# Patient Record
Sex: Female | Born: 1985
Health system: Southern US, Community
[De-identification: ages and names within clinical notes are randomized; demographics above are authoritative.]

## PROBLEM LIST (undated history)

## (undated) DIAGNOSIS — IMO0001 Reserved for inherently not codable concepts without codable children: Secondary | ICD-10-CM

## (undated) DIAGNOSIS — J45909 Unspecified asthma, uncomplicated: Secondary | ICD-10-CM

## (undated) DIAGNOSIS — Z30017 Encounter for initial prescription of implantable subdermal contraceptive: Principal | ICD-10-CM

## (undated) HISTORY — DX: Reserved for inherently not codable concepts without codable children: IMO0001

## (undated) HISTORY — DX: Encounter for initial prescription of implantable subdermal contraceptive: Z30.017

---
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
## 2005-12-08 ENCOUNTER — Other Ambulatory Visit: Admission: RE | Admit: 2005-12-08 | Discharge: 2005-12-08 | Payer: Self-pay | Admitting: Obstetrics and Gynecology

## 2007-04-12 ENCOUNTER — Other Ambulatory Visit: Admission: RE | Admit: 2007-04-12 | Discharge: 2007-04-12 | Payer: Self-pay | Admitting: Obstetrics and Gynecology

## 2008-06-29 ENCOUNTER — Inpatient Hospital Stay (HOSPITAL_COMMUNITY): Admission: AD | Admit: 2008-06-29 | Discharge: 2008-07-02 | Payer: Self-pay | Admitting: Obstetrics and Gynecology

## 2008-06-30 ENCOUNTER — Encounter (INDEPENDENT_AMBULATORY_CARE_PROVIDER_SITE_OTHER): Payer: Self-pay | Admitting: Obstetrics and Gynecology

## 2009-10-04 ENCOUNTER — Other Ambulatory Visit: Admission: RE | Admit: 2009-10-04 | Discharge: 2009-10-04 | Payer: Self-pay | Admitting: Obstetrics and Gynecology

## 2010-07-10 LAB — COMPREHENSIVE METABOLIC PANEL
AST: 23 U/L (ref 0–37)
CO2: 21 mEq/L (ref 19–32)
Calcium: 9.8 mg/dL (ref 8.4–10.5)
Creatinine, Ser: 0.56 mg/dL (ref 0.4–1.2)
GFR calc Af Amer: >60 mL/min (ref >60)
GFR calc non Af Amer: >60 mL/min (ref >60)

## 2010-07-10 LAB — URINALYSIS, ROUTINE W REFLEX MICROSCOPIC
Glucose, UA: NEGATIVE mg/dL
Nitrite: NEGATIVE
Protein, ur: NEGATIVE mg/dL
Urobilinogen, UA: 0.2 mg/dL (ref 0.0–1.0)

## 2010-07-10 LAB — CBC
HCT: 24.5 % — ABNORMAL LOW (ref 36.0–46.0)
HCT: 39.6 % (ref 36.0–46.0)
Hemoglobin: 13.2 g/dL (ref 12.0–15.0)
Hemoglobin: 8.3 g/dL — ABNORMAL LOW (ref 12.0–15.0)
MCHC: 33.2 g/dL (ref 30.0–36.0)
MCHC: 33.4 g/dL (ref 30.0–36.0)
MCHC: 34.1 g/dL (ref 30.0–36.0)
MCV: 91.2 fL (ref 78.0–100.0)
MCV: 92.1 fL (ref 78.0–100.0)
Platelets: 168 10*3/uL (ref 150–400)
RDW: 13.2 % (ref 11.5–15.5)
RDW: 13.3 % (ref 11.5–15.5)
RDW: 13.3 % (ref 11.5–15.5)

## 2010-07-10 LAB — URINE MICROSCOPIC-ADD ON

## 2010-07-10 LAB — RPR: RPR Ser Ql: NONREACTIVE

## 2010-07-10 LAB — URIC ACID: Uric Acid, Serum: 4.1 mg/dL (ref 2.4–7.0)

## 2010-08-13 NOTE — Op Note (Signed)
[name], [name]               ACCOUNT NO.:  [id number]   MEDICAL RECORD NO.:  [id number]          PATIENT TYPE:  INP   LOCATION:  9146                          FACILITY:  [facility]   PHYSICIAN:  Charles A. Delcambre, MDDATE OF BIRTH:  Sep 06, 1985   DATE OF PROCEDURE:  06/30/2008  DATE OF DISCHARGE:                               OPERATIVE REPORT   PREOPERATIVE DIAGNOSES:  1. Intrauterine pregnancy at 39 weeks and 2 days.  2. Intrauterine growth restriction.  3. Nonreassuring fetal heart rate.   POSTOPERATIVE DIAGNOSES:  1. Intrauterine pregnancy at 39 weeks and 2 days.  2. Intrauterine growth restriction.  3. Nonreassuring fetal heart rate.   PROCEDURE:  Primary low transverse cesarean section.   SURGEON:  Charles A. Delcambre, MD   ASSISTANT:  None.   COMPLICATIONS:  None.   ESTIMATED BLOOD LOSS:  600 mL.   OPERATIVE FINDINGS:  Vigorous female, Apgars 9 and 9, 5 pounds 10 ounces.   SPECIMEN:  Placenta to Pathology.   ANESTHESIA:  Epidural.   URINE OUTPUT:  100 mL.   IV FLUIDS:  1000 mL.   Instrument, sponge, and needle count correct x2.   DESCRIPTION OF PROCEDURE:  The patient was taken to the operating room  secondary to nonreassuring fetal heart rate.  She was 8-cm dilated at  that time, but was not making very rapid progress and late decelerations  were noted intermittently and then repetitive.  At that point, I  recommended cesarean section.  She gave informed consent, see notes in  the chart.  Surgery was undertaken with epidural adequate, sterile prep  and drape was undertaken.  Pfannenstiel incision was made with a knife,  carried down to fascia.  The fascia was incised with a knife and Mayo  scissors.  Rectus sheath was bluntly and sharply dissected in the  midline.  Peritoneum was entered bluntly with finger.  Traction was used  to extend the incision.  There was no damage to the bladder.  Self-  retaining retractor was placed with the ring inside and was  swept, no  bowel was under the ring and the ring was rolled down.  Alexis retractor  was placed and hand was swept and no bowel or other structures were into  the ring internally, outer ring was cinched down to allow adequate  exposure.  Vesicouterine peritoneum was incised with Metzenbaum  scissors.  Blunt dissection and some sharp dissection was used take the  bladder flap down.  A transverse incision was made with a knife, carried  down to fat to amniotomy without damage to the infant.  Vertical  traction was used to extend the incision.  Occiput was brought to the  incision site without difficulty.  Fundal pressure was placed by the  operator's assistance and baby was delivered without difficulty, once  vigorous immediately.  Cord was clamped.  Infant was shown to the  parents and handed off to Dr. Alison Murray, who was in attendance with the  delivery.  Placenta was manually expressed, handed off to go to cord  blood collection for the cord bank and then to  Pathology.  Internal  surface of the uterus was wiped with a dry lap.  The uterus was closed  in 2 layers, first layer #1 chromic running locking, second layer #1  chromic running imbricating and nonlocking.  A single figure-of-eight  suture was used to achieve hemostasis after the uterus was free  internalized, it had been externalized for repair.  Irrigation was  carried out.  All hemostatic areas were noted.  All incisions were  hemostatic.  Irrigation was carried out subfascially, pericolic gutters  and bladder area as well as subfascially and subcutaneously.  The  subfascial hemostasis was excellent.  Fascia was closed with #1 Vicryl  running nonlocking suture, subcutaneous hemostasis was excellent.  Skin  was closed with sterile skin clips.  Sterile dressing was applied.  The  patient tolerated the procedure well and was taken to recovery with  physician in attendance.      Charles A. Sydnee Cabal, MD  Electronically  Signed     CAD/MEDQ  D:  06/30/2008  T:  07/01/2008  Job:  [id number]

## 2010-08-16 NOTE — Discharge Summary (Signed)
[name], [name] NO.:  [id number]   MEDICAL RECORD NO.:  [id number]          PATIENT TYPE:  INP   LOCATION:  9146                          FACILITY:  [facility]   PHYSICIAN:  Gerald Leitz, MD          DATE OF BIRTH:  29-May-1985   DATE OF ADMISSION:  06/29/2008  DATE OF DISCHARGE:  07/02/2008                               DISCHARGE SUMMARY   INDICATIONS FOR ADMISSION:  1. A 39 and 1-week intrauterine pregnancy.  2. Gestational hypertension.  3. Small for gestational age.  4. Borderline intrauterine growth retardation.   DISCHARGE DIAGNOSES:  1. A 39 and 1-week intrauterine pregnancy.  2. Gestational hypertension.  3. Small for gestational age.  4. Borderline intrauterine growth retardation.  5. Non-reassuring fetal heart rate and status post primary cesarean      section.   BRIEF HOSPITAL COURSE:  The patient was admitted on June 29, 2008 at 39  weeks and 1-day with gestational hypertension.  Blood pressure was  154/95.  Urinalysis was negative for protein.  Her labs were negative.  She received Cytotec for induction.  She progressed to maximum  dilatation of 3-8 cm and developed non-reassuring fetal heart rate.  Underwent cesarean section on June 30, 2008.  Delivered live born female  infant with Apgar of 9 and 9 at 105 and respectively weighing 5 pounds  10 ounces.  She did well postoperatively and is discharged on postop day  #2 on the following medications Motrin and Percocet.   FOLLOW UP:  Staple removal in 2-3 days.   DISCHARGE CONDITION:  Stable and improved.      Gerald Leitz, MD  Electronically Signed     TC/MEDQ  D:  07/24/2008  T:  07/25/2008  Job:  [id number]

## 2011-02-25 ENCOUNTER — Other Ambulatory Visit (HOSPITAL_COMMUNITY): Payer: Self-pay | Admitting: Family Medicine

## 2011-02-25 DIAGNOSIS — R1011 Right upper quadrant pain: Secondary | ICD-10-CM

## 2011-02-25 DIAGNOSIS — R5383 Other fatigue: Secondary | ICD-10-CM

## 2011-02-25 DIAGNOSIS — R109 Unspecified abdominal pain: Secondary | ICD-10-CM

## 2011-02-28 ENCOUNTER — Other Ambulatory Visit (HOSPITAL_COMMUNITY): Payer: Self-pay

## 2011-02-28 ENCOUNTER — Ambulatory Visit (HOSPITAL_COMMUNITY)
Admission: RE | Admit: 2011-02-28 | Discharge: 2011-02-28 | Disposition: A | Discharge status: Home / Self Care | Payer: PRIVATE HEALTH INSURANCE | Source: Ambulatory Visit | Attending: Family Medicine | Admitting: Family Medicine

## 2011-02-28 DIAGNOSIS — R5383 Other fatigue: Secondary | ICD-10-CM

## 2011-02-28 DIAGNOSIS — R1011 Right upper quadrant pain: Secondary | ICD-10-CM

## 2011-02-28 DIAGNOSIS — R109 Unspecified abdominal pain: Secondary | ICD-10-CM | POA: Insufficient documentation

## 2011-04-01 NOTE — L&D Delivery Note (Addendum)
Delivery Note At 6:19 AM a viable female was delivered via VBAC, Spontaneous (Presentation: Right Occiput Anterior).  APGAR: 8, 9; weight 5 lb 7.8 oz (2489 g).   Placenta status: Intact, Spontaneous.  Cord: 3 vessels with the following complications: None.    NICU team in attendance of birth d/t moderate MSF/IUGR  Anesthesia: Epidural  Episiotomy: None Lacerations: 1st degree;Perineal; Rt Labial Suture Repair: 4.0 monocryl Est. Blood Loss (mL): 300  Mom to postpartum.  Baby to nursery-stable. Placenta to pathology (IUGR, low grade maternal temp)  Wants to breastfeed, depo for contraception.  Marge Duncans 02/29/2012, 6:49 AM

## 2011-06-03 ENCOUNTER — Other Ambulatory Visit: Payer: Self-pay | Admitting: Adult Health

## 2011-06-03 ENCOUNTER — Other Ambulatory Visit (HOSPITAL_COMMUNITY)
Admission: RE | Admit: 2011-06-03 | Discharge: 2011-06-03 | Disposition: A | Discharge status: Home / Self Care | Payer: 59 | Source: Ambulatory Visit | Attending: Obstetrics and Gynecology | Admitting: Obstetrics and Gynecology

## 2011-06-03 DIAGNOSIS — Z01419 Encounter for gynecological examination (general) (routine) without abnormal findings: Secondary | ICD-10-CM | POA: Insufficient documentation

## 2011-07-01 LAB — OB RESULTS CONSOLE HEPATITIS B SURFACE ANTIGEN: Hepatitis B Surface Ag: NEGATIVE

## 2011-07-01 LAB — OB RESULTS CONSOLE ANTIBODY SCREEN: Antibody Screen: NEGATIVE

## 2011-07-01 LAB — OB RESULTS CONSOLE ABO/RH: RH Type: POSITIVE

## 2011-07-01 LAB — OB RESULTS CONSOLE RUBELLA ANTIBODY, IGM: Rubella: IMMUNE

## 2012-02-23 ENCOUNTER — Telehealth (HOSPITAL_COMMUNITY): Payer: Self-pay | Admitting: *Deleted

## 2012-02-23 ENCOUNTER — Encounter (HOSPITAL_COMMUNITY): Payer: Self-pay | Admitting: *Deleted

## 2012-02-23 NOTE — Telephone Encounter (Signed)
Preadmission screen  

## 2012-02-28 ENCOUNTER — Encounter (HOSPITAL_COMMUNITY): Payer: Self-pay | Admitting: Anesthesiology

## 2012-02-28 ENCOUNTER — Inpatient Hospital Stay (HOSPITAL_COMMUNITY)
Admission: RE | Admit: 2012-02-28 | Discharge: 2012-03-01 | DRG: 774 | Disposition: A | Discharge status: Home / Self Care | Payer: PRIVATE HEALTH INSURANCE | Source: Ambulatory Visit | Attending: Family Medicine | Admitting: Family Medicine

## 2012-02-28 ENCOUNTER — Inpatient Hospital Stay (HOSPITAL_COMMUNITY): Payer: PRIVATE HEALTH INSURANCE | Admitting: Anesthesiology

## 2012-02-28 ENCOUNTER — Encounter (HOSPITAL_COMMUNITY): Payer: Self-pay

## 2012-02-28 DIAGNOSIS — O365990 Maternal care for other known or suspected poor fetal growth, unspecified trimester, not applicable or unspecified: Principal | ICD-10-CM | POA: Diagnosis present

## 2012-02-28 DIAGNOSIS — O34219 Maternal care for unspecified type scar from previous cesarean delivery: Secondary | ICD-10-CM | POA: Diagnosis present

## 2012-02-28 DIAGNOSIS — O328XX0 Maternal care for other malpresentation of fetus, not applicable or unspecified: Secondary | ICD-10-CM | POA: Diagnosis present

## 2012-02-28 HISTORY — DX: Unspecified asthma, uncomplicated: J45.909

## 2012-02-28 LAB — CBC
MCH: 31.2 pg (ref 26.0–34.0)
MCHC: 34.4 g/dL (ref 30.0–36.0)
MCV: 90.6 fL (ref 78.0–100.0)
Platelets: 254 10*3/uL (ref 150–400)
RBC: 4.17 MIL/uL (ref 3.87–5.11)
RDW: 12.8 % (ref 11.5–15.5)

## 2012-02-28 LAB — TYPE AND SCREEN: ABO/RH(D): A POS

## 2012-02-28 LAB — RPR: RPR Ser Ql: NONREACTIVE

## 2012-02-28 MED ORDER — FENTANYL 2.5 MCG/ML BUPIVACAINE 1/10 % EPIDURAL INFUSION (WH - ANES)
14.0000 mL/h | INTRAMUSCULAR | Status: DC
  Administered 2012-02-29: 14 mL/h via EPIDURAL
  Filled 2012-02-28 (×2): qty 125

## 2012-02-28 MED ORDER — FENTANYL 2.5 MCG/ML BUPIVACAINE 1/10 % EPIDURAL INFUSION (WH - ANES)
INTRAMUSCULAR | Status: DC | PRN
  Administered 2012-02-28: 14 mL/h via EPIDURAL

## 2012-02-28 MED ORDER — IBUPROFEN 600 MG PO TABS: 600.0000 mg | ORAL_TABLET | Freq: Four times a day (QID) | ORAL | Status: DC | PRN

## 2012-02-28 MED ORDER — OXYTOCIN 40 UNITS IN LACTATED RINGERS INFUSION - SIMPLE MED
1.0000 m[IU]/min | INTRAVENOUS | Status: DC
  Administered 2012-02-28: 1 m[IU]/min via INTRAVENOUS
  Filled 2012-02-28: qty 1000

## 2012-02-28 MED ORDER — LIDOCAINE HCL (PF) 1 % IJ SOLN
INTRAMUSCULAR | Status: DC | PRN
  Administered 2012-02-28 (×2): 8 mL

## 2012-02-28 MED ORDER — TERBUTALINE SULFATE 1 MG/ML IJ SOLN: 0.2500 mg | Freq: Once | INTRAMUSCULAR | Status: AC | PRN

## 2012-02-28 MED ORDER — OXYTOCIN BOLUS FROM INFUSION
500.0000 mL | INTRAVENOUS | Status: DC
  Administered 2012-02-29: 500 mL via INTRAVENOUS

## 2012-02-28 MED ORDER — LACTATED RINGERS IV SOLN
500.0000 mL | INTRAVENOUS | Status: DC | PRN
  Administered 2012-02-29: 500 mL via INTRAVENOUS

## 2012-02-28 MED ORDER — NALBUPHINE SYRINGE 5 MG/0.5 ML: 5.0000 mg | INJECTION | INTRAMUSCULAR | Status: DC | PRN

## 2012-02-28 MED ORDER — LACTATED RINGERS IV SOLN: 500.0000 mL | Freq: Once | INTRAVENOUS | Status: DC

## 2012-02-28 MED ORDER — PHENYLEPHRINE 40 MCG/ML (10ML) SYRINGE FOR IV PUSH (FOR BLOOD PRESSURE SUPPORT): 80.0000 ug | PREFILLED_SYRINGE | INTRAVENOUS | Status: DC | PRN

## 2012-02-28 MED ORDER — HYDROXYZINE HCL 50 MG PO TABS: 50.0000 mg | ORAL_TABLET | Freq: Four times a day (QID) | ORAL | Status: DC | PRN

## 2012-02-28 MED ORDER — LACTATED RINGERS IV SOLN
INTRAVENOUS | Status: DC
  Administered 2012-02-28 (×2): via INTRAVENOUS
  Administered 2012-02-28: 125 mL/h via INTRAVENOUS
  Administered 2012-02-28 (×2): via INTRAVENOUS
  Administered 2012-02-29: 125 mL/h via INTRAVENOUS

## 2012-02-28 MED ORDER — HYDROXYZINE HCL 50 MG/ML IM SOLN: 50.0000 mg | Freq: Four times a day (QID) | INTRAMUSCULAR | Status: DC | PRN

## 2012-02-28 MED ORDER — FLEET ENEMA 7-19 GM/118ML RE ENEM: 1.0000 | ENEMA | RECTAL | Status: DC | PRN

## 2012-02-28 MED ORDER — LIDOCAINE HCL (PF) 1 % IJ SOLN
30.0000 mL | INTRAMUSCULAR | Status: DC | PRN
  Filled 2012-02-28: qty 30

## 2012-02-28 MED ORDER — OXYCODONE-ACETAMINOPHEN 5-325 MG PO TABS: 1.0000 | ORAL_TABLET | ORAL | Status: DC | PRN

## 2012-02-28 MED ORDER — PHENYLEPHRINE 40 MCG/ML (10ML) SYRINGE FOR IV PUSH (FOR BLOOD PRESSURE SUPPORT)
80.0000 ug | PREFILLED_SYRINGE | INTRAVENOUS | Status: DC | PRN
  Filled 2012-02-28: qty 5

## 2012-02-28 MED ORDER — OXYTOCIN 40 UNITS IN LACTATED RINGERS INFUSION - SIMPLE MED: 62.5000 mL/h | INTRAVENOUS | Status: DC

## 2012-02-28 MED ORDER — ONDANSETRON HCL 4 MG/2ML IJ SOLN
4.0000 mg | Freq: Four times a day (QID) | INTRAMUSCULAR | Status: DC | PRN
  Administered 2012-02-29: 4 mg via INTRAVENOUS
  Filled 2012-02-28: qty 2

## 2012-02-28 MED ORDER — DIPHENHYDRAMINE HCL 50 MG/ML IJ SOLN: 12.5000 mg | INTRAMUSCULAR | Status: DC | PRN

## 2012-02-28 MED ORDER — EPHEDRINE 5 MG/ML INJ: 10.0000 mg | INTRAVENOUS | Status: DC | PRN

## 2012-02-28 MED ORDER — EPHEDRINE 5 MG/ML INJ
10.0000 mg | INTRAVENOUS | Status: DC | PRN
  Filled 2012-02-28: qty 4

## 2012-02-28 MED ORDER — ACETAMINOPHEN 325 MG PO TABS: 650.0000 mg | ORAL_TABLET | ORAL | Status: DC | PRN

## 2012-02-28 MED ORDER — CITRIC ACID-SODIUM CITRATE 334-500 MG/5ML PO SOLN: 30.0000 mL | ORAL | Status: DC | PRN

## 2012-02-28 NOTE — H&P (Signed)
[name] is a 26 y.o. G2P1001 female at [redacted]w[redacted]d by LMP which correlates well w/ 7wk u/s, presenting for IOL d/t IUGR <3% w/ normal AFI and dopplers.  Has been followed w/ twice weekly testing: NSTs alternating w/ U/S.  Last growth u/s on chart @ 36.3wk, 2034gm/4lb 8oz/<3%. H/O PLTCS for NRFHR in 2010- had gotten to 8cm, desires TOLAC, consent signed and on chart.  Receives regular pnc at [facility], w/ onset at 4wks.   Genetic screening normal per pt, unable to locate on prenatal records.  Normal 2hr glucola, GBS neg.  Anatomy screen normal except bilateral EICF noted.   History OB History    Grav Para Term Preterm Abortions TAB SAB Ect Mult Living   2 1 1       1      Past Medical History  Diagnosis Date  . Asthma    Past Surgical History  Procedure Date  . Cesarean section    Family History: family history is not on file. Social History:  reports that she has never smoked. She does not have any smokeless tobacco history on file. She reports that she does not drink alcohol or use illicit drugs.   Prenatal Transfer Tool  Maternal Diabetes: No Genetic Screening: Normal Maternal Ultrasounds/Referrals: Abnormal:  Findings:   IUGR, Other: bilateral EICF Fetal Ultrasounds or other Referrals:  None Maternal Substance Abuse:  No Significant Maternal Medications:  None Significant Maternal Lab Results:  Lab values include: Group B Strep negative Other Comments:  None  Review of Systems  Constitutional: Negative.   HENT: Negative.   Eyes: Negative.   Respiratory: Negative.   Cardiovascular: Negative.   Gastrointestinal: Negative.   Genitourinary: Negative.   Musculoskeletal: Negative.   Skin: Negative.   Neurological: Negative.   Endo/Heme/Allergies: Negative.   Psychiatric/Behavioral: Negative.     Dilation: 1.5 Effacement (%): 50 Station: -3 Exam by:: K. Janautica Netzley CNM Blood pressure 143/80, pulse 97, temperature 98.5 F (36.9 C), temperature source Oral, resp. rate 20, height 5'  10" (1.778 m), weight 73.936 kg (163 lb), last menstrual period 05/29/2011.  Cervical foley bulb inserted and inflated w/ 60ml LR w/ small amount difficulty d/t cervix being so posterior  Maternal Exam:  Uterine Assessment: Contraction strength is mild.  Contraction frequency is irregular.   Abdomen: Surgical scars: low transverse.   Fetal presentation: vertex  Introitus: Normal vulva. Normal vagina.  Pelvis: adequate for delivery.   Cervix: Cervix evaluated by digital exam.     Physical Exam  Constitutional: She is oriented to person, place, and time. She appears well-developed and well-nourished.  HENT:  Head: Normocephalic.  Neck: Normal range of motion.  Cardiovascular: Normal rate and regular rhythm.   Respiratory: Effort normal and breath sounds normal.  GI: Soft.       gravid  Genitourinary: Vagina normal and uterus normal.       Sve: 1.5/50/-3, vtx, posterior  Musculoskeletal: Normal range of motion.  Neurological: She is alert and oriented to person, place, and time. She has normal reflexes.  Skin: Skin is warm and dry.  Psychiatric: She has a normal mood and affect. Her behavior is normal. Judgment and thought content normal.    FHR: 140, mod variability, 15x15accels, no decels= Cat I UCs: mild, irregular  Prenatal labs: ABO, Rh: A/Positive/-- (04/02 0000) Antibody: Negative (04/02 0000) Rubella: Immune (04/02 0000) RPR: Nonreactive (04/02 0000)  HBsAg: Negative (04/02 0000)  HIV: Non-reactive (04/02 0000)  GBS: Negative (11/07 0000)  2hr glucola: 96,04,54  Assessment/Plan: A:  [redacted]w[redacted]d SIUP  IOL for IUGR <3%   TOLAC  GBS neg  Cat I FHR  Bilateral EICF noted on u/s  P:  Admit to BS  Cervical foley bulb then pitocin  IV pain meds when needed/epidural when in active labor  Anticipate NSVD     Marge Duncans 02/28/2012, 10:41 AM

## 2012-02-28 NOTE — Anesthesia Preprocedure Evaluation (Signed)
Anesthesia Evaluation  Patient identified by MRN, date of birth, ID band Patient awake    Reviewed: Allergy & Precautions, H&P , NPO status , Patient's Chart, lab work & pertinent test results  Airway Mallampati: II TM Distance: >3 FB Neck ROM: full    Dental No notable dental hx.    Pulmonary    Pulmonary exam normal       Cardiovascular negative cardio ROS      Neuro/Psych negative neurological ROS  negative psych ROS   GI/Hepatic negative GI ROS, Neg liver ROS,   Endo/Other  negative endocrine ROS  Renal/GU negative Renal ROS  negative genitourinary   Musculoskeletal negative musculoskeletal ROS (+)   Abdominal Normal abdominal exam  (+)   Peds negative pediatric ROS (+)  Hematology negative hematology ROS (+)   Anesthesia Other Findings   Reproductive/Obstetrics (+) Pregnancy                           Anesthesia Physical Anesthesia Plan  ASA: II  Anesthesia Plan: Epidural   Post-op Pain Management:    Induction:   Airway Management Planned:   Additional Equipment:   Intra-op Plan:   Post-operative Plan:   Informed Consent: I have reviewed the patients History and Physical, chart, labs and discussed the procedure including the risks, benefits and alternatives for the proposed anesthesia with the patient or authorized representative who has indicated his/her understanding and acceptance.     Plan Discussed with:   Anesthesia Plan Comments:         Anesthesia Quick Evaluation

## 2012-02-28 NOTE — Progress Notes (Signed)
VE by Dr.Constant, attempted to reduce infant's hand without success, pt tol well

## 2012-02-28 NOTE — Progress Notes (Signed)
CNM in department and notified of FHR tracing and RN interventions. CNM reviewed tracing and stated RN could increase pitocin level. Will continue to monitor.

## 2012-02-28 NOTE — Progress Notes (Signed)
[name] is a 26 y.o. G2P1001 at [redacted]w[redacted]d admitted for induction of labor due to IUGR.  Subjective: Feeling painful uc's in low back, mom supportive at bs.  Objective: BP 140/78  Pulse 105  Temp 97.9 F (36.6 C) (Oral)  Resp 20  Ht 5\' 10"  (1.778 m)  Wt 73.936 kg (163 lb)  BMI 23.39 kg/m2  SpO2 97%  LMP 05/29/2011      FHT:  FHR: 135 bpm, variability: moderate,  accelerations:  Present,  decelerations:  Absent UC:   regular, every 3-4 minutes SVE:   4.5/50/-2, post, bbow AROM large amount light MSF Compound hand/arm felt on maternal Lt side of fetal head Prolonged decel after arom, w/ maternal position change, iv fluid bolus, O2 @ 10l/min given   Labs: Lab Results  Component Value Date   WBC 11.4* 02/28/2012   HGB 13.0 02/28/2012   HCT 37.8 02/28/2012   MCV 90.6 02/28/2012   PLT 254 02/28/2012    Assessment / Plan: IOL d/t IUGR, currently on 70mu/min pitocin, now arom'd- vtx w/ compound hand/arm  Labor: early labor Preeclampsia:  n/a Fetal Wellbeing:  Category I before arom, Cat II after Pain Control:  Labor support without medications I/D:  n/a Anticipated MOD:  TOLAC  Notified dr. Jolayne Panther of above, will attempt to reduce compound hand once fhr reassuring  Marge Duncans 02/28/2012, 6:29 PM

## 2012-02-28 NOTE — Progress Notes (Signed)
[name] is a 26 y.o. G2P1001 at [redacted]w[redacted]d admitted for induction of labor due to IUGR.  Subjective: Getting uncomfortable w/ uc's.  Contemplating iv pain meds vs. Epidural. Family supportive at bs.   Objective: BP 137/56  Pulse 90  Temp 97.9 F (36.6 C) (Oral)  Resp 20  Ht 5\' 10"  (1.778 m)  Wt 73.936 kg (163 lb)  BMI 23.39 kg/m2  SpO2 100%  LMP 05/29/2011      FHT:  FHR: 155 bpm, variability: moderate,  accelerations:  Present,  decelerations:  Present earlies/variables UC:   regular, every 2-4 minutes SVE:   4.5, hand presenting- called dr. Jolayne Panther to come attempt reduction of fetal hand             Dr. Jolayne Panther attempted w/o success- will attempt again at further dilation  Labs: Lab Results  Component Value Date   WBC 11.4* 02/28/2012   HGB 13.0 02/28/2012   HCT 37.8 02/28/2012   MCV 90.6 02/28/2012   PLT 254 02/28/2012    Assessment / Plan: IOL d/t IUGR, no progression yet, pitocin currently at 20mu/min- continue to increase pitocin per protocol to acheive adequate uc pattern/dilation Fetal hand presentation  Asking if she can change her mind and have repeat c/s- notified her that she can change her mind at any time.  Offered to answer any questions- pt had none at this time, and at this time wants to proceed w/ TOLAC.  Labor: early labor Preeclampsia:  n/a Fetal Wellbeing:  Category II Pain Control:  labor support w/o meds at this time, contemplating iv pain meds vs. epidural I/D:  n/a Anticipated MOD:  TOLAC  Marge Duncans 02/28/2012, 8:49 PM

## 2012-02-28 NOTE — Progress Notes (Signed)
[name] is a 26 y.o. G2P1001 at [redacted]w[redacted]d admitted for induction of labor/TOLAC due to iugr.  Subjective: Beginning to feel uc's some.  FOB supportive at bs.  Objective: BP 130/77  Pulse 89  Temp 98.1 F (36.7 C) (Oral)  Resp 20  Ht 5\' 10"  (1.778 m)  Wt 73.936 kg (163 lb)  BMI 23.39 kg/m2  LMP 05/29/2011      FHT:  FHR: 130 bpm, variability: moderate,  accelerations:  Present,  decelerations:  Absent UC:   irregular, every 4-6 minutes SVE:   Dilation: 4.5 Effacement (%): 50 Station: -3 Exam by:: Raliegh Ip RN Cervical foley bulb out  Labs: Lab Results  Component Value Date   WBC 11.4* 02/28/2012   HGB 13.0 02/28/2012   HCT 37.8 02/28/2012   MCV 90.6 02/28/2012   PLT 254 02/28/2012    Assessment / Plan: IOL d/t iugr, cervical ripening phase complete, RN to begin pitocin per low-dose protocol  Labor: not yet Preeclampsia:  n/a Fetal Wellbeing:  Category I Pain Control:  Labor support without medications I/D:  n/a Anticipated MOD:  VBAC  Marge Duncans 02/28/2012, 3:27 PM

## 2012-02-28 NOTE — Anesthesia Procedure Notes (Signed)
Epidural Patient location during procedure: OB Start time: 02/28/2012 10:22 PM End time: 02/28/2012 10:26 PM  Staffing Anesthesiologist: Sandrea Hughs Performed by: anesthesiologist   Preanesthetic Checklist Completed: patient identified, site marked, surgical consent, pre-op evaluation, timeout performed, IV checked, risks and benefits discussed and monitors and equipment checked  Epidural Patient position: sitting Prep: site prepped and draped and DuraPrep Patient monitoring: continuous pulse ox and blood pressure Approach: midline Injection technique: LOR air  Needle:  Needle type: Tuohy  Needle gauge: 17 G Needle length: 9 cm and 9 Needle insertion depth: 6 cm Catheter type: closed end flexible Catheter size: 19 Gauge Catheter at skin depth: 11 cm Test dose: negative and Other  Assessment Sensory level: T9 Events: blood not aspirated, injection not painful, no injection resistance, negative IV test and no paresthesia  Additional Notes Reason for block:procedure for pain

## 2012-02-29 ENCOUNTER — Encounter (HOSPITAL_COMMUNITY): Payer: Self-pay

## 2012-02-29 DIAGNOSIS — O365990 Maternal care for other known or suspected poor fetal growth, unspecified trimester, not applicable or unspecified: Secondary | ICD-10-CM

## 2012-02-29 MED ORDER — TETANUS-DIPHTH-ACELL PERTUSSIS 5-2.5-18.5 LF-MCG/0.5 IM SUSP
0.5000 mL | Freq: Once | INTRAMUSCULAR | Status: AC
  Administered 2012-03-01: 0.5 mL via INTRAMUSCULAR
  Filled 2012-02-29: qty 0.5

## 2012-02-29 MED ORDER — OXYCODONE-ACETAMINOPHEN 5-325 MG PO TABS: 1.0000 | ORAL_TABLET | ORAL | Status: DC | PRN

## 2012-02-29 MED ORDER — ONDANSETRON HCL 4 MG PO TABS: 4.0000 mg | ORAL_TABLET | ORAL | Status: DC | PRN

## 2012-02-29 MED ORDER — WITCH HAZEL-GLYCERIN EX PADS: 1.0000 "application " | MEDICATED_PAD | CUTANEOUS | Status: DC | PRN

## 2012-02-29 MED ORDER — SODIUM CHLORIDE 0.9 % IV SOLN: 250.0000 mL | INTRAVENOUS | Status: DC | PRN

## 2012-02-29 MED ORDER — SIMETHICONE 80 MG PO CHEW: 80.0000 mg | CHEWABLE_TABLET | ORAL | Status: DC | PRN

## 2012-02-29 MED ORDER — FLEET ENEMA 7-19 GM/118ML RE ENEM: 1.0000 | ENEMA | Freq: Every day | RECTAL | Status: DC | PRN

## 2012-02-29 MED ORDER — OXYTOCIN 40 UNITS IN LACTATED RINGERS INFUSION - SIMPLE MED: 62.5000 mL/h | INTRAVENOUS | Status: DC | PRN

## 2012-02-29 MED ORDER — SENNOSIDES-DOCUSATE SODIUM 8.6-50 MG PO TABS
2.0000 | ORAL_TABLET | Freq: Every day | ORAL | Status: DC
  Administered 2012-02-29: 2 via ORAL

## 2012-02-29 MED ORDER — ZOLPIDEM TARTRATE 5 MG PO TABS: 5.0000 mg | ORAL_TABLET | Freq: Every evening | ORAL | Status: DC | PRN

## 2012-02-29 MED ORDER — SODIUM CHLORIDE 0.9 % IJ SOLN: 3.0000 mL | INTRAMUSCULAR | Status: DC | PRN

## 2012-02-29 MED ORDER — DIBUCAINE 1 % RE OINT: 1.0000 "application " | TOPICAL_OINTMENT | RECTAL | Status: DC | PRN

## 2012-02-29 MED ORDER — LANOLIN HYDROUS EX OINT: TOPICAL_OINTMENT | CUTANEOUS | Status: DC | PRN

## 2012-02-29 MED ORDER — SODIUM CHLORIDE 0.9 % IJ SOLN: 3.0000 mL | Freq: Two times a day (BID) | INTRAMUSCULAR | Status: DC

## 2012-02-29 MED ORDER — PRENATAL MULTIVITAMIN CH
1.0000 | ORAL_TABLET | Freq: Every day | ORAL | Status: DC
  Administered 2012-02-29 – 2012-03-01 (×2): 1 via ORAL
  Filled 2012-02-29 (×2): qty 1

## 2012-02-29 MED ORDER — LACTATED RINGERS IV SOLN
INTRAVENOUS | Status: DC
  Administered 2012-02-29: 300 mL/h via INTRAUTERINE

## 2012-02-29 MED ORDER — MEASLES, MUMPS & RUBELLA VAC ~~LOC~~ INJ
0.5000 mL | INJECTION | Freq: Once | SUBCUTANEOUS | Status: DC
  Filled 2012-02-29: qty 0.5

## 2012-02-29 MED ORDER — IBUPROFEN 600 MG PO TABS
600.0000 mg | ORAL_TABLET | Freq: Four times a day (QID) | ORAL | Status: DC
  Administered 2012-02-29 – 2012-03-01 (×4): 600 mg via ORAL
  Filled 2012-02-29 (×5): qty 1

## 2012-02-29 MED ORDER — DIPHENHYDRAMINE HCL 25 MG PO CAPS: 25.0000 mg | ORAL_CAPSULE | Freq: Four times a day (QID) | ORAL | Status: DC | PRN

## 2012-02-29 MED ORDER — ONDANSETRON HCL 4 MG/2ML IJ SOLN: 4.0000 mg | INTRAMUSCULAR | Status: DC | PRN

## 2012-02-29 MED ORDER — BISACODYL 10 MG RE SUPP: 10.0000 mg | Freq: Every day | RECTAL | Status: DC | PRN

## 2012-02-29 MED ORDER — BENZOCAINE-MENTHOL 20-0.5 % EX AERO: 1.0000 "application " | INHALATION_SPRAY | CUTANEOUS | Status: DC | PRN

## 2012-02-29 NOTE — Progress Notes (Signed)
[name] is a 26 y.o. G2P1001 at [redacted]w[redacted]d admitted for induction of labor due to iugr.  Subjective: Mpstly comfortable w/ epidural, feeling some pressure.  Objective: BP 137/77  Pulse 106  Temp 99.6 F (37.6 C) (Oral)  Resp 16  Ht 5\' 10"  (1.778 m)  Wt 73.936 kg (163 lb)  BMI 23.39 kg/m2  SpO2 100%  LMP 05/29/2011   Total I/O In: -  Out: 650 [Urine:650]  FHT:  FHR: 145 bpm, variability: moderate,  accelerations:  Present,  decelerations:  Absent UC:   regular, every 2-3 minutes SVE:   Dilation: 7 Effacement (%): 80 Station: +2 Exam by:: Genella Rife CNM MVUs: 110-175 Adequate return from amnioinfusion  Labs: Lab Results  Component Value Date   WBC 11.4* 02/28/2012   HGB 13.0 02/28/2012   HCT 37.8 02/28/2012   MCV 90.6 02/28/2012   PLT 254 02/28/2012    Assessment / Plan: Induction of labor due to IUGR,  progressing well on pitocin, currently @ 87mu/min  Labor: Progressing normally Preeclampsia:  n/a Fetal Wellbeing:  Category I Pain Control:  Epidural I/D:  n/a Anticipated MOD:  VBAC  Marge Duncans 02/29/2012, 4:25 AM

## 2012-02-29 NOTE — Addendum Note (Signed)
Addendum  created 02/29/12 2014 by Vencil Basnett D Jennise Both, CRNA   Modules edited:Charges VN, Notes Section    

## 2012-02-29 NOTE — Progress Notes (Signed)
[name] is a 26 y.o. G2P1001 at [redacted]w[redacted]d admitted for induction of labor due to iugr.  Subjective: Comfortable w/ epidural, no complaints.  Family supportive at bs.  Objective: BP 139/83  Pulse 101  Temp 98.8 F (37.1 C) (Axillary)  Resp 18  Ht 5\' 10"  (1.778 m)  Wt 73.936 kg (163 lb)  BMI 23.39 kg/m2  SpO2 100%  LMP 05/29/2011      FHT:  FHR: 155 bpm, variability: moderate,  accelerations:  Abscent,  decelerations:  Present repetitive variables UC:   regular, every 2-4 minutes SVE:   Dilation: 5.5 Effacement (%): 70 Station: -2 Exam by:: Dr. Cathlean Cower Dr. Jolayne Panther able to reduce compound hand  Labs: Lab Results  Component Value Date   WBC 11.4* 02/28/2012   HGB 13.0 02/28/2012   HCT 37.8 02/28/2012   MCV 90.6 02/28/2012   PLT 254 02/28/2012    Assessment / Plan: IOL d/t iugr, pitocin currently at 13mu/min, some cervical change and compound hand now reduced.  If repetitive variables continue will place iupc w/ amnioinfusion  Labor: making cervical change Preeclampsia:  n/a Fetal Wellbeing:  Category II Pain Control:  Epidural I/D:  n/a Anticipated MOD:  TOLAC  Marge Duncans 02/29/2012, 12:43 AM

## 2012-02-29 NOTE — Progress Notes (Signed)
[name] is a 26 y.o. G2P1001 at [redacted]w[redacted]d admitted for induction of labor due to iugr.  Subjective: Comfortable w/ epidural, no complaints. Family supportive at bs.  Objective: BP 127/70  Pulse 95  Temp 98.8 F (37.1 C) (Axillary)  Resp 18  Ht 5\' 10"  (1.778 m)  Wt 73.936 kg (163 lb)  BMI 23.39 kg/m2  SpO2 100%  LMP 05/29/2011   Total I/O In: -  Out: 650 [Urine:650]  FHT:  FHR: 145 bpm, variability: moderate,  accelerations:  Abscent,  decelerations:  Present repetitive variables UC:   regular, every 2-4 minutes SVE:   Dilation: 6 Effacement (%): 70 Station: -2;-1 Exam by:: Genella Rife CNM IUPC placed w/o difficulty  Labs: Lab Results  Component Value Date   WBC 11.4* 02/28/2012   HGB 13.0 02/28/2012   HCT 37.8 02/28/2012   MCV 90.6 02/28/2012   PLT 254 02/28/2012    Assessment / Plan: Induction of labor due to IUGR,  progressing well on pitocin, currently at 68mu/min.  Pitocin turned off for now to allow variables to resolve. Will reinitiate once fhr reassuring Amnioinfusion bolus then 166ml/hr  Labor: Progressing normally Preeclampsia:  n/a Fetal Wellbeing:  Category II Pain Control:  Epidural I/D:  n/a Anticipated MOD:  TOLAC  Marge Duncans 02/29/2012, 1:26 AM

## 2012-02-29 NOTE — Anesthesia Postprocedure Evaluation (Signed)
  Anesthesia Post-op Note  Patient: [name]  Procedure(s) Performed: * No procedures listed *  Patient Location: PACU and Mother/Baby  Anesthesia Type:Epidural  Level of Consciousness: awake, alert  and oriented  Airway and Oxygen Therapy: Patient Spontanous Breathing    Post-op Assessment: Patient's Cardiovascular Status Stable and Respiratory Function Stable  Post-op Vital Signs: stable  Complications: No apparent anesthesia complications

## 2012-02-29 NOTE — Addendum Note (Signed)
Addendum  created 02/29/12 2014 by Len Blalock, CRNA   Modules edited:Charges VN, Notes Section

## 2012-02-29 NOTE — Progress Notes (Signed)
[name] is a 26 y.o. G2P1001 at [redacted]w[redacted]d admitted for induction of labor due to IUGR.  Subjective: Patient comfortable with epidural, without complaints  Objective: BP 135/84  Pulse 92  Temp 98.8 F (37.1 C) (Axillary)  Resp 18  Ht 5\' 10"  (1.778 m)  Wt 73.936 kg (163 lb)  BMI 23.39 kg/m2  SpO2 100%  LMP 05/29/2011      FHT:  FHR: 150 bpm, variability: moderate,  accelerations:  Abscent,  decelerations:  Present variable decels UC:   regular, every 3-4 minutes SVE:   Dilation: 5.5 Effacement (%): 70 Station: -2 Exam by:: Dr. Ladean Raya  Labs: Lab Results  Component Value Date   WBC 11.4* 02/28/2012   HGB 13.0 02/28/2012   HCT 37.8 02/28/2012   MCV 90.6 02/28/2012   PLT 254 02/28/2012    Assessment / Plan: Induction of labor due to IUGR,  progressing well on pitocin  Labor: Progressing normally. Fetal hand which was presenting has been reduced. IUPC with amnioinfusion if variables persists Preeclampsia:  n/a Fetal Wellbeing:  Category II Pain Control:  Epidural Anticipated MOD:  NSVD  Kenitra Leventhal 02/29/2012, 12:41 AM

## 2012-02-29 NOTE — Progress Notes (Signed)
CNM in department and notified that decelerations are not resolving with rn interventions and loss of variability in FHR. Will continue to monitor.

## 2012-03-01 ENCOUNTER — Ambulatory Visit: Payer: PRIVATE HEALTH INSURANCE

## 2012-03-01 MED ORDER — IBUPROFEN 600 MG PO TABS: 600.0000 mg | ORAL_TABLET | Freq: Four times a day (QID) | ORAL | Status: DC

## 2012-03-01 NOTE — Discharge Summary (Signed)
Obstetric Discharge Summary Reason for Admission: onset of labor Prenatal Procedures: none Intrapartum Procedures: spontaneous vaginal delivery Postpartum Procedures: none Complications-Operative and Postpartum: none Hemoglobin  Date Value Range Status  02/28/2012 13.0  12.0 - 15.0 g/dL Final     HCT  Date Value Range Status  02/28/2012 37.8  36.0 - 46.0 % Final    Physical Exam:  General: alert, cooperative and no distress Lochia: appropriate Uterine Fundus: firm DVT Evaluation: No evidence of DVT seen on physical exam.  Discharge Diagnoses: Term Pregnancy-delivered  Discharge Information: Date: 03/01/2012 Activity: pelvic rest Diet: routine Medications: Ibuprofen Condition: stable Instructions: refer to practice specific booklet Discharge to: home Follow-up Information    Follow up with [facility]. Schedule an appointment as soon as possible for a visit in 4 weeks.   Contact information:   [address] [place] [address] [phone]         Newborn Data: Live born female  Birth Weight: 5 lb 7.8 oz (2489 g) APGAR: 8, 9  Home with mother. Continue breastfeeding. Follow up with [facility] Contraception: depo  Governor Specking 03/01/2012, 8:59 AM Evaluation and management procedures were performed by Resident physician under my supervision/collaboration. Chart reviewed, patient examined by me and I agree with management and plan.

## 2012-03-01 NOTE — H&P (Signed)
Attestation of Attending Supervision of Advanced Practitioner (CNM/NP): Evaluation and management procedures were performed by the Advanced Practitioner under my supervision and collaboration.  I have reviewed the Advanced Practitioner's note and chart, and I agree with the management and plan.  Candies Palm 03/01/2012 12:43 PM

## 2012-11-03 ENCOUNTER — Encounter: Payer: Self-pay | Admitting: Adult Health

## 2012-11-03 ENCOUNTER — Ambulatory Visit (INDEPENDENT_AMBULATORY_CARE_PROVIDER_SITE_OTHER): Payer: PRIVATE HEALTH INSURANCE | Admitting: Adult Health

## 2012-11-03 VITALS — BP 114/70 | Ht 69.0 in | Wt 157.0 lb

## 2012-11-03 DIAGNOSIS — Z3009 Encounter for other general counseling and advice on contraception: Secondary | ICD-10-CM

## 2012-11-03 DIAGNOSIS — IMO0001 Reserved for inherently not codable concepts without codable children: Secondary | ICD-10-CM

## 2012-11-03 HISTORY — DX: Reserved for inherently not codable concepts without codable children: IMO0001

## 2012-11-03 NOTE — Progress Notes (Signed)
Subjective:     Patient ID: [name], female   DOB: 1985/09/15, 27 y.o.   MRN: [id number]  HPI Aisling is here is discuss getting nexplanon.She had Implanon before and likes it. No sex recently.  Review of Systems See HPI Reviewed past medical,surgical, social and family history. Reviewed medications and allergies.     Objective:   Physical Exam BP 114/70  Ht 5\' 9"  (1.753 m)  Wt 157 lb (71.215 kg)  BMI 23.17 kg/m2  LMP 10/13/2012   discussed nexplanon will schedule insertion with next period.  Assessment:     Contraceptive counseling     Plan:     Return 8/15 for nexplanon insertion,should be in period then, call before then if starts and no sex til after insertion(leaves for beach the 17 th.

## 2012-11-03 NOTE — Patient Instructions (Addendum)
No sex Return 8/15 for nexplanon insertion if starts period before then call me

## 2012-11-12 ENCOUNTER — Encounter: Payer: Self-pay | Admitting: Adult Health

## 2012-11-12 ENCOUNTER — Ambulatory Visit (INDEPENDENT_AMBULATORY_CARE_PROVIDER_SITE_OTHER): Payer: PRIVATE HEALTH INSURANCE | Admitting: Adult Health

## 2012-11-12 VITALS — BP 128/68 | Ht 69.0 in | Wt 158.0 lb

## 2012-11-12 DIAGNOSIS — IMO0001 Reserved for inherently not codable concepts without codable children: Secondary | ICD-10-CM

## 2012-11-12 DIAGNOSIS — N946 Dysmenorrhea, unspecified: Secondary | ICD-10-CM

## 2012-11-12 DIAGNOSIS — Z30017 Encounter for initial prescription of implantable subdermal contraceptive: Secondary | ICD-10-CM

## 2012-11-12 DIAGNOSIS — Z3049 Encounter for surveillance of other contraceptives: Secondary | ICD-10-CM

## 2012-11-12 DIAGNOSIS — Z3202 Encounter for pregnancy test, result negative: Secondary | ICD-10-CM

## 2012-11-12 HISTORY — DX: Encounter for initial prescription of implantable subdermal contraceptive: Z30.017

## 2012-11-12 LAB — POCT URINE PREGNANCY: Preg Test, Ur: NEGATIVE

## 2012-11-12 MED ORDER — IBUPROFEN 800 MG PO TABS: 800.0000 mg | ORAL_TABLET | Freq: Three times a day (TID) | ORAL | Status: AC | PRN

## 2012-11-12 NOTE — Patient Instructions (Addendum)
Use condoms x 2 weeks, keep clean and dry x 24 hours, no heavy lifting, keep steri strips on x 72 hours, Keep pressure dressing on x 24 hours. Follow up prn problems. Try motrin for cramps Pap in march 2015

## 2012-11-12 NOTE — Progress Notes (Signed)
Subjective:     Patient ID: [name], female   DOB: 05/17/1985, 27 y.o.   MRN: [id number]  HPI [name] is a 27 year old white female married in for nexplanon insertion,she is having some period cramps with this period.  Review of Systems See HPI   Reviewed past medical,surgical, social and family history. Reviewed medications and allergies.  Objective:   Physical Exam BP 128/68  Ht 5\' 9"  (1.753 m)  Wt 158 lb (71.668 kg)  BMI 23.32 kg/m2  LMP 11/10/2012   Urine pregnancy test negative,consent signed, Left arm cleansed with betadine, and injected with 1.5 cc 2% lidocaine and waited til numb. Nexplanon easily inserted and steri strips applied. Pressure dressing applied.Easily palpated by pt and provider.  Assessment:      Nexplanon insertion Contraception Period cramps    Plan:     Rx motrin 800 mg #60 1 every 8 hours prn with 1 refill Use condoms x 2 weeks, keep clean and dry x 24 hours, no heavy lifting, keep steri strips on x 72 hours, Keep pressure dressing on x 24 hours. Follow up prn problems.   Pap 05/2013 Remove nexplanon 11/13/2015

## 2012-12-14 ENCOUNTER — Telehealth: Payer: Self-pay | Admitting: Adult Health

## 2012-12-14 MED ORDER — NAPROXEN SODIUM 550 MG PO TABS: 550.0000 mg | ORAL_TABLET | Freq: Three times a day (TID) | ORAL | Status: AC

## 2012-12-14 NOTE — Telephone Encounter (Signed)
Pt states that 800mg  Ibuprofen is not working. Can she get something stronger?

## 2012-12-14 NOTE — Telephone Encounter (Signed)
Still with period cramps and motrin niot working will try anaprox ds, if not better call

## 2014-01-30 ENCOUNTER — Encounter: Payer: Self-pay | Admitting: Adult Health

## 2015-05-04 ENCOUNTER — Telehealth: Payer: Self-pay | Admitting: *Deleted

## 2015-05-04 DIAGNOSIS — Z3200 Encounter for pregnancy test, result unknown: Secondary | ICD-10-CM

## 2015-05-04 NOTE — Telephone Encounter (Signed)
I spoke with pt and she stated that she had a positive pregnancy test and she has the nexplanon. Pt states that she has also had some nausea and vomiting. Pt states that she had seen online that you could have a false positive and she just wanted to be sure. I advised the pt I would a provider and for her to call this morning.   I spoke with Dr. Emelda Fear and he advised for the pt to come and have a QHCG done and go from there.   I spoke with the pt and advised her of above and she is going to get her blood drawn on Monday.

## 2015-05-08 ENCOUNTER — Telehealth: Payer: Self-pay | Admitting: *Deleted

## 2015-05-08 LAB — BETA HCG QUANT (REF LAB): hCG Quant: <1 m[IU]/mL

## 2015-05-08 NOTE — Telephone Encounter (Signed)
Pt informed of Negative QHCG from 05/07/2015.

## 2017-02-09 DIAGNOSIS — Z0000 Encounter for general adult medical examination without abnormal findings: Secondary | ICD-10-CM | POA: Diagnosis not present

## 2017-02-11 DIAGNOSIS — Z01419 Encounter for gynecological examination (general) (routine) without abnormal findings: Secondary | ICD-10-CM | POA: Diagnosis not present

## 2017-03-18 DIAGNOSIS — F331 Major depressive disorder, recurrent, moderate: Secondary | ICD-10-CM | POA: Diagnosis not present

## 2017-07-06 DIAGNOSIS — J329 Chronic sinusitis, unspecified: Secondary | ICD-10-CM | POA: Diagnosis not present

## 2017-07-06 DIAGNOSIS — J111 Influenza due to unidentified influenza virus with other respiratory manifestations: Secondary | ICD-10-CM | POA: Diagnosis not present

## 2017-12-09 DIAGNOSIS — M25561 Pain in right knee: Secondary | ICD-10-CM | POA: Diagnosis not present

## 2017-12-09 DIAGNOSIS — S82091A Other fracture of right patella, initial encounter for closed fracture: Secondary | ICD-10-CM | POA: Diagnosis not present

## 2017-12-17 DIAGNOSIS — M25561 Pain in right knee: Secondary | ICD-10-CM | POA: Diagnosis not present

## 2017-12-21 DIAGNOSIS — D2261 Melanocytic nevi of right upper limb, including shoulder: Secondary | ICD-10-CM | POA: Diagnosis not present

## 2017-12-21 DIAGNOSIS — D485 Neoplasm of uncertain behavior of skin: Secondary | ICD-10-CM | POA: Diagnosis not present

## 2017-12-21 DIAGNOSIS — D224 Melanocytic nevi of scalp and neck: Secondary | ICD-10-CM | POA: Diagnosis not present

## 2017-12-23 DIAGNOSIS — M25561 Pain in right knee: Secondary | ICD-10-CM | POA: Diagnosis not present

## 2018-01-04 DIAGNOSIS — M25561 Pain in right knee: Secondary | ICD-10-CM | POA: Diagnosis not present

## 2018-01-05 DIAGNOSIS — M6281 Muscle weakness (generalized): Secondary | ICD-10-CM | POA: Diagnosis not present

## 2018-01-05 DIAGNOSIS — R262 Difficulty in walking, not elsewhere classified: Secondary | ICD-10-CM | POA: Diagnosis not present

## 2018-01-05 DIAGNOSIS — M25661 Stiffness of right knee, not elsewhere classified: Secondary | ICD-10-CM | POA: Diagnosis not present

## 2018-01-05 DIAGNOSIS — M25561 Pain in right knee: Secondary | ICD-10-CM | POA: Diagnosis not present

## 2018-01-06 DIAGNOSIS — M25561 Pain in right knee: Secondary | ICD-10-CM | POA: Diagnosis not present

## 2018-01-06 DIAGNOSIS — M25661 Stiffness of right knee, not elsewhere classified: Secondary | ICD-10-CM | POA: Diagnosis not present

## 2018-01-06 DIAGNOSIS — M6281 Muscle weakness (generalized): Secondary | ICD-10-CM | POA: Diagnosis not present

## 2018-01-06 DIAGNOSIS — R262 Difficulty in walking, not elsewhere classified: Secondary | ICD-10-CM | POA: Diagnosis not present

## 2018-01-14 DIAGNOSIS — M25661 Stiffness of right knee, not elsewhere classified: Secondary | ICD-10-CM | POA: Diagnosis not present

## 2018-01-14 DIAGNOSIS — R262 Difficulty in walking, not elsewhere classified: Secondary | ICD-10-CM | POA: Diagnosis not present

## 2018-01-14 DIAGNOSIS — M6281 Muscle weakness (generalized): Secondary | ICD-10-CM | POA: Diagnosis not present

## 2018-01-14 DIAGNOSIS — M25561 Pain in right knee: Secondary | ICD-10-CM | POA: Diagnosis not present

## 2018-01-19 DIAGNOSIS — M6281 Muscle weakness (generalized): Secondary | ICD-10-CM | POA: Diagnosis not present

## 2018-01-19 DIAGNOSIS — R262 Difficulty in walking, not elsewhere classified: Secondary | ICD-10-CM | POA: Diagnosis not present

## 2018-01-19 DIAGNOSIS — M25661 Stiffness of right knee, not elsewhere classified: Secondary | ICD-10-CM | POA: Diagnosis not present

## 2018-01-19 DIAGNOSIS — M25561 Pain in right knee: Secondary | ICD-10-CM | POA: Diagnosis not present

## 2018-01-21 DIAGNOSIS — M25561 Pain in right knee: Secondary | ICD-10-CM | POA: Diagnosis not present

## 2018-01-21 DIAGNOSIS — R262 Difficulty in walking, not elsewhere classified: Secondary | ICD-10-CM | POA: Diagnosis not present

## 2018-01-21 DIAGNOSIS — M6281 Muscle weakness (generalized): Secondary | ICD-10-CM | POA: Diagnosis not present

## 2018-01-21 DIAGNOSIS — M25661 Stiffness of right knee, not elsewhere classified: Secondary | ICD-10-CM | POA: Diagnosis not present

## 2018-02-01 DIAGNOSIS — S82034D Nondisplaced transverse fracture of right patella, subsequent encounter for closed fracture with routine healing: Secondary | ICD-10-CM | POA: Diagnosis not present

## 2018-06-22 DIAGNOSIS — R05 Cough: Secondary | ICD-10-CM | POA: Diagnosis not present

## 2018-06-22 DIAGNOSIS — J019 Acute sinusitis, unspecified: Secondary | ICD-10-CM | POA: Diagnosis not present

## 2018-06-22 DIAGNOSIS — R509 Fever, unspecified: Secondary | ICD-10-CM | POA: Diagnosis not present

## 2018-06-22 DIAGNOSIS — H6122 Impacted cerumen, left ear: Secondary | ICD-10-CM | POA: Diagnosis not present

## 2018-06-22 DIAGNOSIS — R0981 Nasal congestion: Secondary | ICD-10-CM | POA: Diagnosis not present

## 2018-06-22 DIAGNOSIS — J111 Influenza due to unidentified influenza virus with other respiratory manifestations: Secondary | ICD-10-CM | POA: Diagnosis not present

## 2018-11-26 DIAGNOSIS — R1030 Lower abdominal pain, unspecified: Secondary | ICD-10-CM | POA: Diagnosis not present

## 2019-01-25 ENCOUNTER — Other Ambulatory Visit: Payer: Self-pay | Admitting: *Deleted

## 2019-01-25 DIAGNOSIS — Z20822 Contact with and (suspected) exposure to covid-19: Secondary | ICD-10-CM

## 2019-01-26 LAB — NOVEL CORONAVIRUS, NAA: SARS-CoV-2, NAA: NOT DETECTED

## 2019-09-14 DIAGNOSIS — H9202 Otalgia, left ear: Secondary | ICD-10-CM | POA: Diagnosis not present

## 2019-09-14 DIAGNOSIS — J069 Acute upper respiratory infection, unspecified: Secondary | ICD-10-CM | POA: Diagnosis not present

## 2019-09-14 DIAGNOSIS — F331 Major depressive disorder, recurrent, moderate: Secondary | ICD-10-CM | POA: Diagnosis not present

## 2019-09-14 DIAGNOSIS — J019 Acute sinusitis, unspecified: Secondary | ICD-10-CM | POA: Diagnosis not present

## 2019-09-21 DIAGNOSIS — Z0000 Encounter for general adult medical examination without abnormal findings: Secondary | ICD-10-CM | POA: Diagnosis not present

## 2019-09-21 DIAGNOSIS — H6123 Impacted cerumen, bilateral: Secondary | ICD-10-CM | POA: Diagnosis not present

## 2019-10-03 ENCOUNTER — Encounter (HOSPITAL_COMMUNITY): Payer: Self-pay | Admitting: Emergency Medicine

## 2019-10-03 ENCOUNTER — Emergency Department (HOSPITAL_COMMUNITY)
Admission: EM | Admit: 2019-10-03 | Discharge: 2019-10-04 | Disposition: A | Discharge status: Home / Self Care | Payer: BC Managed Care – PPO | Attending: Emergency Medicine | Admitting: Emergency Medicine

## 2019-10-03 ENCOUNTER — Other Ambulatory Visit: Payer: Self-pay

## 2019-10-03 ENCOUNTER — Emergency Department (HOSPITAL_COMMUNITY): Payer: BC Managed Care – PPO

## 2019-10-03 DIAGNOSIS — R109 Unspecified abdominal pain: Secondary | ICD-10-CM | POA: Insufficient documentation

## 2019-10-03 DIAGNOSIS — R05 Cough: Secondary | ICD-10-CM | POA: Diagnosis not present

## 2019-10-03 DIAGNOSIS — J45909 Unspecified asthma, uncomplicated: Secondary | ICD-10-CM | POA: Insufficient documentation

## 2019-10-03 DIAGNOSIS — R1012 Left upper quadrant pain: Secondary | ICD-10-CM | POA: Diagnosis not present

## 2019-10-03 LAB — CBC
HCT: 40.3 % (ref 36.0–46.0)
Hemoglobin: 13.3 g/dL (ref 12.0–15.0)
MCH: 31.7 pg (ref 26.0–34.0)
MCHC: 33 g/dL (ref 30.0–36.0)
MCV: 96.2 fL (ref 80.0–100.0)
Platelets: 258 10*3/uL (ref 150–400)
RBC: 4.19 MIL/uL (ref 3.87–5.11)
RDW: 12.3 % (ref 11.5–15.5)
WBC: 10.3 10*3/uL (ref 4.0–10.5)
nRBC: 0 % (ref 0.0–0.2)

## 2019-10-03 LAB — COMPREHENSIVE METABOLIC PANEL
ALT: 18 U/L (ref 0–44)
AST: 16 U/L (ref 15–41)
Albumin: 4.2 g/dL (ref 3.5–5.0)
Alkaline Phosphatase: 48 U/L (ref 38–126)
Anion gap: 9 (ref 5–15)
BUN: 13 mg/dL (ref 6–20)
CO2: 26 mmol/L (ref 22–32)
Calcium: 9.1 mg/dL (ref 8.9–10.3)
Chloride: 104 mmol/L (ref 98–111)
Creatinine, Ser: 0.74 mg/dL (ref 0.44–1.00)
GFR calc Af Amer: >60 mL/min (ref >60)
GFR calc non Af Amer: >60 mL/min (ref >60)
Glucose, Bld: 119 mg/dL — ABNORMAL HIGH (ref 70–99)
Potassium: 3.1 mmol/L — ABNORMAL LOW (ref 3.5–5.1)
Sodium: 139 mmol/L (ref 135–145)
Total Bilirubin: 0.2 mg/dL — ABNORMAL LOW (ref 0.3–1.2)
Total Protein: 7.4 g/dL (ref 6.5–8.1)

## 2019-10-03 LAB — LIPASE, BLOOD: Lipase: 38 U/L (ref 11–51)

## 2019-10-03 MED ORDER — DICYCLOMINE HCL 10 MG PO CAPS
10.0000 mg | ORAL_CAPSULE | Freq: Once | ORAL | Status: AC
  Administered 2019-10-03: 10 mg via ORAL
  Filled 2019-10-03: qty 1

## 2019-10-03 MED ORDER — KETOROLAC TROMETHAMINE 30 MG/ML IJ SOLN
30.0000 mg | Freq: Once | INTRAMUSCULAR | Status: AC
  Administered 2019-10-03: 30 mg via INTRAVENOUS
  Filled 2019-10-03: qty 1

## 2019-10-03 MED ORDER — SODIUM CHLORIDE 0.9% FLUSH: 3.0000 mL | Freq: Once | INTRAVENOUS | Status: DC

## 2019-10-03 NOTE — ED Triage Notes (Addendum)
Pt c/o cough, back, bilateral ribs and stoamch pain that began today. Denies n/v/d

## 2019-10-04 LAB — URINALYSIS, ROUTINE W REFLEX MICROSCOPIC
Bilirubin Urine: NEGATIVE
Glucose, UA: NEGATIVE mg/dL
Ketones, ur: NEGATIVE mg/dL
Leukocytes,Ua: NEGATIVE
Nitrite: NEGATIVE
Protein, ur: NEGATIVE mg/dL
Specific Gravity, Urine: 1.011 (ref 1.005–1.030)
pH: 7 (ref 5.0–8.0)

## 2019-10-04 MED ORDER — DICYCLOMINE HCL 20 MG PO TABS: 20.0000 mg | ORAL_TABLET | Freq: Two times a day (BID) | ORAL | 0 refills | Status: AC | PRN

## 2019-10-04 NOTE — ED Provider Notes (Signed)
[facility] EMERGENCY DEPARTMENT Provider Note   CSN: [id number] Arrival date & time: 10/03/19  2058     History Chief Complaint  Patient presents with  . Abdominal Pain    [name] is a 34 y.o. female.  Somewhat similar to previous IBS. Hasn't tried anything for symptoms.    Abdominal Pain Pain location:  LUQ Pain quality: aching, bloating and sharp   Pain radiates to:  Does not radiate Pain severity:  Mild Duration:  1 day Timing:  Intermittent Progression:  Waxing and waning Chronicity:  New Context: not alcohol use, not diet changes, not eating, not previous surgeries, not recent illness, not retching and not sick contacts   Worsened by:  Coughing, movement and palpation Ineffective treatments:  None tried      Past Medical History:  Diagnosis Date  . Asthma   . Contraception 11/03/2012   Wants nexplanon will schedule for insertion  . Nexplanon insertion 11/12/2012   nexplanon inserted 11/12/12 left arm remove 11/13/15    Patient Active Problem List   Diagnosis Date Noted  . Nexplanon insertion 11/12/2012  . Contraception 11/03/2012    Past Surgical History:  Procedure Laterality Date  . CESAREAN SECTION       OB History    Gravida  2   Para  2   Term  2   Preterm      AB      Living  2     SAB      TAB      Ectopic      Multiple      Live Births  2           Family History  Problem Relation Age of Onset  . Cancer Maternal Aunt        breast    Social History   Tobacco Use  . Smoking status: Never Smoker  . Smokeless tobacco: Never Used  Substance Use Topics  . Alcohol use: No  . Drug use: No    Home Medications Prior to Admission medications   Medication Sig Start Date End Date Taking? Authorizing Provider  dicyclomine (BENTYL) 20 MG tablet Take 1 tablet (20 mg total) by mouth 2 (two) times daily as needed for spasms (abdominal cramping). 10/04/19   Laurinda Carreno, Barbara Cower, MD  etonogestrel (NEXPLANON) 68 MG IMPL  implant Inject 1 each into the skin once.    [provider]  ibuprofen (ADVIL,MOTRIN) 800 MG tablet Take 1 tablet (800 mg total) by mouth every 8 (eight) hours as needed for pain. 11/12/12   Adline Potter, NP  naproxen sodium (ANAPROX) 550 MG tablet Take 1 tablet (550 mg total) by mouth 3 (three) times daily with meals. 12/14/12   Adline Potter, NP  Prenatal Vit-Fe Fumarate-FA (PRENATAL MULTIVITAMIN) TABS Take 1 tablet by mouth daily.    [provider]    Allergies    Zithromax [azithromycin]  Review of Systems   Review of Systems  Gastrointestinal: Positive for abdominal pain.  All other systems reviewed and are negative.   Physical Exam Updated Vital Signs BP 123/68   Pulse 77   Temp 98.5 F (36.9 C) (Oral)   Resp 18   Ht 5\' 8"  (1.727 m)   Wt 73.5 kg   LMP 09/26/2019   SpO2 100%   BMI 24.63 kg/m   Physical Exam Vitals and nursing note reviewed.  Constitutional:      Appearance: She is well-developed.  HENT:  Head: Normocephalic and atraumatic.     Mouth/Throat:     Mouth: Mucous membranes are moist.     Pharynx: Oropharynx is clear.  Cardiovascular:     Rate and Rhythm: Normal rate and regular rhythm.     Pulses: Normal pulses.  Pulmonary:     Effort: Pulmonary effort is normal. No respiratory distress.     Breath sounds: No stridor.  Abdominal:     General: Abdomen is flat. There is no distension.     Palpations: Abdomen is soft.     Tenderness: There is no abdominal tenderness. There is no guarding or rebound.     Hernia: No hernia is present.  Musculoskeletal:        General: No swelling or tenderness. Normal range of motion.     Cervical back: Normal range of motion.  Skin:    General: Skin is warm and dry.     Coloration: Skin is not jaundiced or pale.  Neurological:     Mental Status: She is alert.     ED Results / Procedures / Treatments   Labs (all labs ordered are listed, but only abnormal results are  displayed) Labs Reviewed  COMPREHENSIVE METABOLIC PANEL - Abnormal; Notable for the following components:      Result Value   Potassium 3.1 (*)    Glucose, Bld 119 (*)    Total Bilirubin 0.2 (*)    All other components within normal limits  URINALYSIS, ROUTINE W REFLEX MICROSCOPIC - Abnormal; Notable for the following components:   Color, Urine STRAW (*)    Hgb urine dipstick SMALL (*)    Bacteria, UA RARE (*)    All other components within normal limits  LIPASE, BLOOD  CBC  POC URINE PREG, ED    EKG None  Radiology DG Chest Portable 1 View  Result Date: 10/03/2019 CLINICAL DATA:  Cough and rib pain, initial encounter EXAM: PORTABLE CHEST 1 VIEW COMPARISON:  None. FINDINGS: The heart size and mediastinal contours are within normal limits. Both lungs are clear. The visualized skeletal structures are unremarkable. IMPRESSION: No acute abnormality noted. Electronically Signed   By: Alcide Clever M.D.   On: 10/03/2019 22:36    Procedures Procedures (including critical care time)  Medications Ordered in ED Medications  sodium chloride flush (NS) 0.9 % injection 3 mL (has no administration in time range)  dicyclomine (BENTYL) capsule 10 mg (10 mg Oral Given 10/03/19 2324)  ketorolac (TORADOL) 30 MG/ML injection 30 mg (30 mg Intravenous Given 10/03/19 2324)    ED Course  I have reviewed the triage vital signs and the nursing notes.  Pertinent labs & imaging results that were available during my care of the patient were reviewed by me and considered in my medical decision making (see chart for details).    MDM Rules/Calculators/A&P                          Workup reassuring. Pain improved with bentyl. Offered CT scan and further workup, states she prefers to try bentyl as it is similar to previous IBS. Without other signs pointing towards true abdominal emergency, feel it is reasonable to discharge on medications with pcp follow up or return here if worsening symptoms.  Final  Clinical Impression(s) / ED Diagnoses Final diagnoses:  Abdominal pain, unspecified abdominal location    Rx / DC Orders ED Discharge Orders         Ordered    dicyclomine (  BENTYL) 20 MG tablet  2 times daily PRN     Discontinue  Reprint     10/04/19 0208           Shahla Betsill, Barbara Cower, MD 10/04/19 [phone]

## 2020-07-07 ENCOUNTER — Other Ambulatory Visit: Payer: Self-pay

## 2020-07-07 ENCOUNTER — Emergency Department (HOSPITAL_COMMUNITY)
Admission: EM | Admit: 2020-07-07 | Discharge: 2020-07-07 | Disposition: A | Discharge status: Home / Self Care | Payer: BLUE CROSS/BLUE SHIELD | Attending: Emergency Medicine | Admitting: Emergency Medicine

## 2020-07-07 ENCOUNTER — Encounter (HOSPITAL_COMMUNITY): Payer: Self-pay | Admitting: Emergency Medicine

## 2020-07-07 ENCOUNTER — Emergency Department (HOSPITAL_COMMUNITY): Payer: BLUE CROSS/BLUE SHIELD

## 2020-07-07 DIAGNOSIS — J45909 Unspecified asthma, uncomplicated: Secondary | ICD-10-CM | POA: Diagnosis not present

## 2020-07-07 DIAGNOSIS — R1011 Right upper quadrant pain: Secondary | ICD-10-CM | POA: Insufficient documentation

## 2020-07-07 DIAGNOSIS — R1084 Generalized abdominal pain: Secondary | ICD-10-CM

## 2020-07-07 DIAGNOSIS — R1031 Right lower quadrant pain: Secondary | ICD-10-CM | POA: Diagnosis not present

## 2020-07-07 LAB — COMPREHENSIVE METABOLIC PANEL
ALT: 22 U/L (ref 0–44)
AST: 17 U/L (ref 15–41)
Albumin: 4.6 g/dL (ref 3.5–5.0)
Alkaline Phosphatase: 51 U/L (ref 38–126)
Anion gap: 12 (ref 5–15)
BUN: 8 mg/dL (ref 6–20)
CO2: 25 mmol/L (ref 22–32)
Calcium: 9.3 mg/dL (ref 8.9–10.3)
Chloride: 104 mmol/L (ref 98–111)
Creatinine, Ser: 0.71 mg/dL (ref 0.44–1.00)
GFR, Estimated: >60 mL/min (ref >60)
Glucose, Bld: 96 mg/dL (ref 70–99)
Potassium: 3.5 mmol/L (ref 3.5–5.1)
Sodium: 141 mmol/L (ref 135–145)
Total Bilirubin: 0.6 mg/dL (ref 0.3–1.2)
Total Protein: 8.3 g/dL — ABNORMAL HIGH (ref 6.5–8.1)

## 2020-07-07 LAB — CBC WITH DIFFERENTIAL/PLATELET
Abs Immature Granulocytes: 0.04 10*3/uL (ref 0.00–0.07)
Basophils Absolute: 0.1 10*3/uL (ref 0.0–0.1)
Basophils Relative: 1 %
Eosinophils Absolute: 0.4 10*3/uL (ref 0.0–0.5)
Eosinophils Relative: 3 %
HCT: 43.6 % (ref 36.0–46.0)
Hemoglobin: 15 g/dL (ref 12.0–15.0)
Immature Granulocytes: 0 %
Lymphocytes Relative: 33 %
Lymphs Abs: 4 10*3/uL (ref 0.7–4.0)
MCH: 32.6 pg (ref 26.0–34.0)
MCHC: 34.4 g/dL (ref 30.0–36.0)
MCV: 94.8 fL (ref 80.0–100.0)
Monocytes Absolute: 0.8 10*3/uL (ref 0.1–1.0)
Monocytes Relative: 7 %
Neutro Abs: 6.8 10*3/uL (ref 1.7–7.7)
Neutrophils Relative %: 56 %
Platelets: 297 10*3/uL (ref 150–400)
RBC: 4.6 MIL/uL (ref 3.87–5.11)
RDW: 11.9 % (ref 11.5–15.5)
WBC: 12.1 10*3/uL — ABNORMAL HIGH (ref 4.0–10.5)
nRBC: 0 % (ref 0.0–0.2)

## 2020-07-07 LAB — I-STAT BETA HCG BLOOD, ED (MC, WL, AP ONLY): I-stat hCG, quantitative: <5 m[IU]/mL (ref <5)

## 2020-07-07 LAB — URINALYSIS, ROUTINE W REFLEX MICROSCOPIC
Bilirubin Urine: NEGATIVE
Glucose, UA: NEGATIVE mg/dL
Ketones, ur: NEGATIVE mg/dL
Leukocytes,Ua: NEGATIVE
Nitrite: NEGATIVE
Protein, ur: NEGATIVE mg/dL
Specific Gravity, Urine: 1.006 (ref 1.005–1.030)
pH: 6 (ref 5.0–8.0)

## 2020-07-07 LAB — LIPASE, BLOOD: Lipase: 36 U/L (ref 11–51)

## 2020-07-07 MED ORDER — IOHEXOL 300 MG/ML  SOLN
100.0000 mL | Freq: Once | INTRAMUSCULAR | Status: AC | PRN
  Administered 2020-07-07: 75 mL via INTRAVENOUS

## 2020-07-07 NOTE — Discharge Instructions (Signed)

## 2020-07-07 NOTE — ED Provider Notes (Signed)
[facility] EMERGENCY DEPARTMENT Provider Note   CSN: [id number] Arrival date & time: 07/07/20  1832     History Chief Complaint  Patient presents with  . Abdominal Pain    [name] is a 35 y.o. female who presents emergency department chief complaint of abdominal pain.  Patient states that this morning she began having pain just to the right side of her umbilicus.  It has been progressively worsening throughout the day and is now radiating toward her back.  She also has pain in the right upper quadrant radiating down toward the right lower quadrant.  She is concerned she might have appendicitis.  She has a history of previous episode of biliary colic.  She denies any vaginal symptoms, urinary symptoms, fever or chills.  Pain is moderate.  She describes the pain as sharp, worse with standing and movement.  She denies fevers, chills, nausea, vomiting, diarrhea.  HPI     Past Medical History:  Diagnosis Date  . Asthma   . Contraception 11/03/2012   Wants nexplanon will schedule for insertion  . Nexplanon insertion 11/12/2012   nexplanon inserted 11/12/12 left arm remove 11/13/15    Patient Active Problem List   Diagnosis Date Noted  . Nexplanon insertion 11/12/2012  . Contraception 11/03/2012    Past Surgical History:  Procedure Laterality Date  . CESAREAN SECTION       OB History    Gravida  2   Para  2   Term  2   Preterm      AB      Living  2     SAB      IAB      Ectopic      Multiple      Live Births  2           Family History  Problem Relation Age of Onset  . Cancer Maternal Aunt        breast    Social History   Tobacco Use  . Smoking status: Never Smoker  . Smokeless tobacco: Never Used  Vaping Use  . Vaping Use: Never used  Substance Use Topics  . Alcohol use: No  . Drug use: No    Home Medications Prior to Admission medications   Medication Sig Start Date End Date Taking? Authorizing Provider  dicyclomine (BENTYL) 20  MG tablet Take 1 tablet (20 mg total) by mouth 2 (two) times daily as needed for spasms (abdominal cramping). 10/04/19   Mesner, Barbara Cower, MD  etonogestrel (NEXPLANON) 68 MG IMPL implant Inject 1 each into the skin once.    [provider]  ibuprofen (ADVIL,MOTRIN) 800 MG tablet Take 1 tablet (800 mg total) by mouth every 8 (eight) hours as needed for pain. 11/12/12   Adline Potter, NP  naproxen sodium (ANAPROX) 550 MG tablet Take 1 tablet (550 mg total) by mouth 3 (three) times daily with meals. 12/14/12   Adline Potter, NP  Prenatal Vit-Fe Fumarate-FA (PRENATAL MULTIVITAMIN) TABS Take 1 tablet by mouth daily.    [provider]    Allergies    Zithromax [azithromycin]  Review of Systems   Review of Systems Ten systems reviewed and are negative for acute change, except as noted in the HPI.   Physical Exam Updated Vital Signs BP 134/79 (BP Location: Right Arm)   Pulse (!) 101   Temp 98.2 F (36.8 C) (Oral)   Resp 16   Ht 5\' 8"  (1.727 m)  Wt 76.2 kg   LMP 07/07/2020   SpO2 100%   BMI 25.54 kg/m   Physical Exam Vitals and nursing note reviewed. Exam conducted with a chaperone present.  Constitutional:      General: She is not in acute distress.    Appearance: She is well-developed. She is not diaphoretic.  HENT:     Head: Normocephalic and atraumatic.  Eyes:     General: No scleral icterus.    Conjunctiva/sclera: Conjunctivae normal.  Cardiovascular:     Rate and Rhythm: Normal rate and regular rhythm.     Heart sounds: Normal heart sounds. No murmur heard. No friction rub. No gallop.   Pulmonary:     Effort: Pulmonary effort is normal. No respiratory distress.     Breath sounds: Normal breath sounds.  Abdominal:     General: Bowel sounds are normal. There is no distension.     Palpations: Abdomen is soft. There is no mass.     Tenderness: There is abdominal tenderness in the right upper quadrant, right lower quadrant, epigastric area and  periumbilical area. There is no right CVA tenderness, left CVA tenderness, guarding or rebound. Positive signs include McBurney's sign. Negative signs include Murphy's sign and Rovsing's sign.  Genitourinary:    Vagina: Normal.     Cervix: No cervical motion tenderness.     Uterus: Normal.      Adnexa:        Right: No mass or tenderness.         Left: No mass or tenderness.    Musculoskeletal:     Cervical back: Normal range of motion.  Skin:    General: Skin is warm and dry.  Neurological:     Mental Status: She is alert and oriented to person, place, and time.  Psychiatric:        Behavior: Behavior normal.     ED Results / Procedures / Treatments   Labs (all labs ordered are listed, but only abnormal results are displayed) Labs Reviewed  CBC WITH DIFFERENTIAL/PLATELET - Abnormal; Notable for the following components:      Result Value   WBC 12.1 (*)    All other components within normal limits  COMPREHENSIVE METABOLIC PANEL  LIPASE, BLOOD  URINALYSIS, ROUTINE W REFLEX MICROSCOPIC  I-STAT BETA HCG BLOOD, ED ([facility], [facility], [facility] ONLY)    EKG None  Radiology No results found.  Procedures Procedures  Medications Ordered in ED Medications - No data to display  ED Course  I have reviewed the triage vital signs and the nursing notes.  Pertinent labs & imaging results that were available during my care of the patient were reviewed by me and considered in my medical decision making (see chart for details).    MDM Rules/Calculators/A&P                          Patient here with abdominal pain. The differential diagnosis for generalized abdominal pain includes, but is not limited to AAA, gastroenteritis, appendicitis, Bowel obstruction, Bowel perforation. Gastroparesis, DKA, Hernia, Inflammatory bowel disease, mesenteric ischemia, pancreatitis, peritonitis SBP, volvulus. I ordered and reviewed labs which include CBC which shows slightly elevated white blood cell count which  appears to be normal for the patient.  UA shows large amount of hemoglobin patient is currently menstruating.  Lipase within normal limits, ordered reviewed reviewed CT imaging which shows no acute abnormalities.  Bimanual pelvic exam reveals no CMT adnexal fullness or tenderness.  Patient is asymptomatic at this time without pain.  She may be having biliary colic.  She was appropriate for discharge this time. Final Clinical Impression(s) / ED Diagnoses Final diagnoses:  None    Rx / DC Orders ED Discharge Orders    None       Arthor Captain, PA-C 07/07/20 2303    Terald Sleeper, MD 07/08/20 [phone]

## 2020-07-07 NOTE — ED Triage Notes (Signed)
Pt c/o abdominal pain in RU and RL quadrant that began today.  The pain also radiates to her back.

## 2021-04-25 IMAGING — DX DG CHEST 1V PORT
1 series · 1 of 1 positions shown · non-contrast
Comparison: None.

CLINICAL DATA: Cough and rib pain, initial encounter

EXAM:
PORTABLE CHEST 1 VIEW

[chest ap]
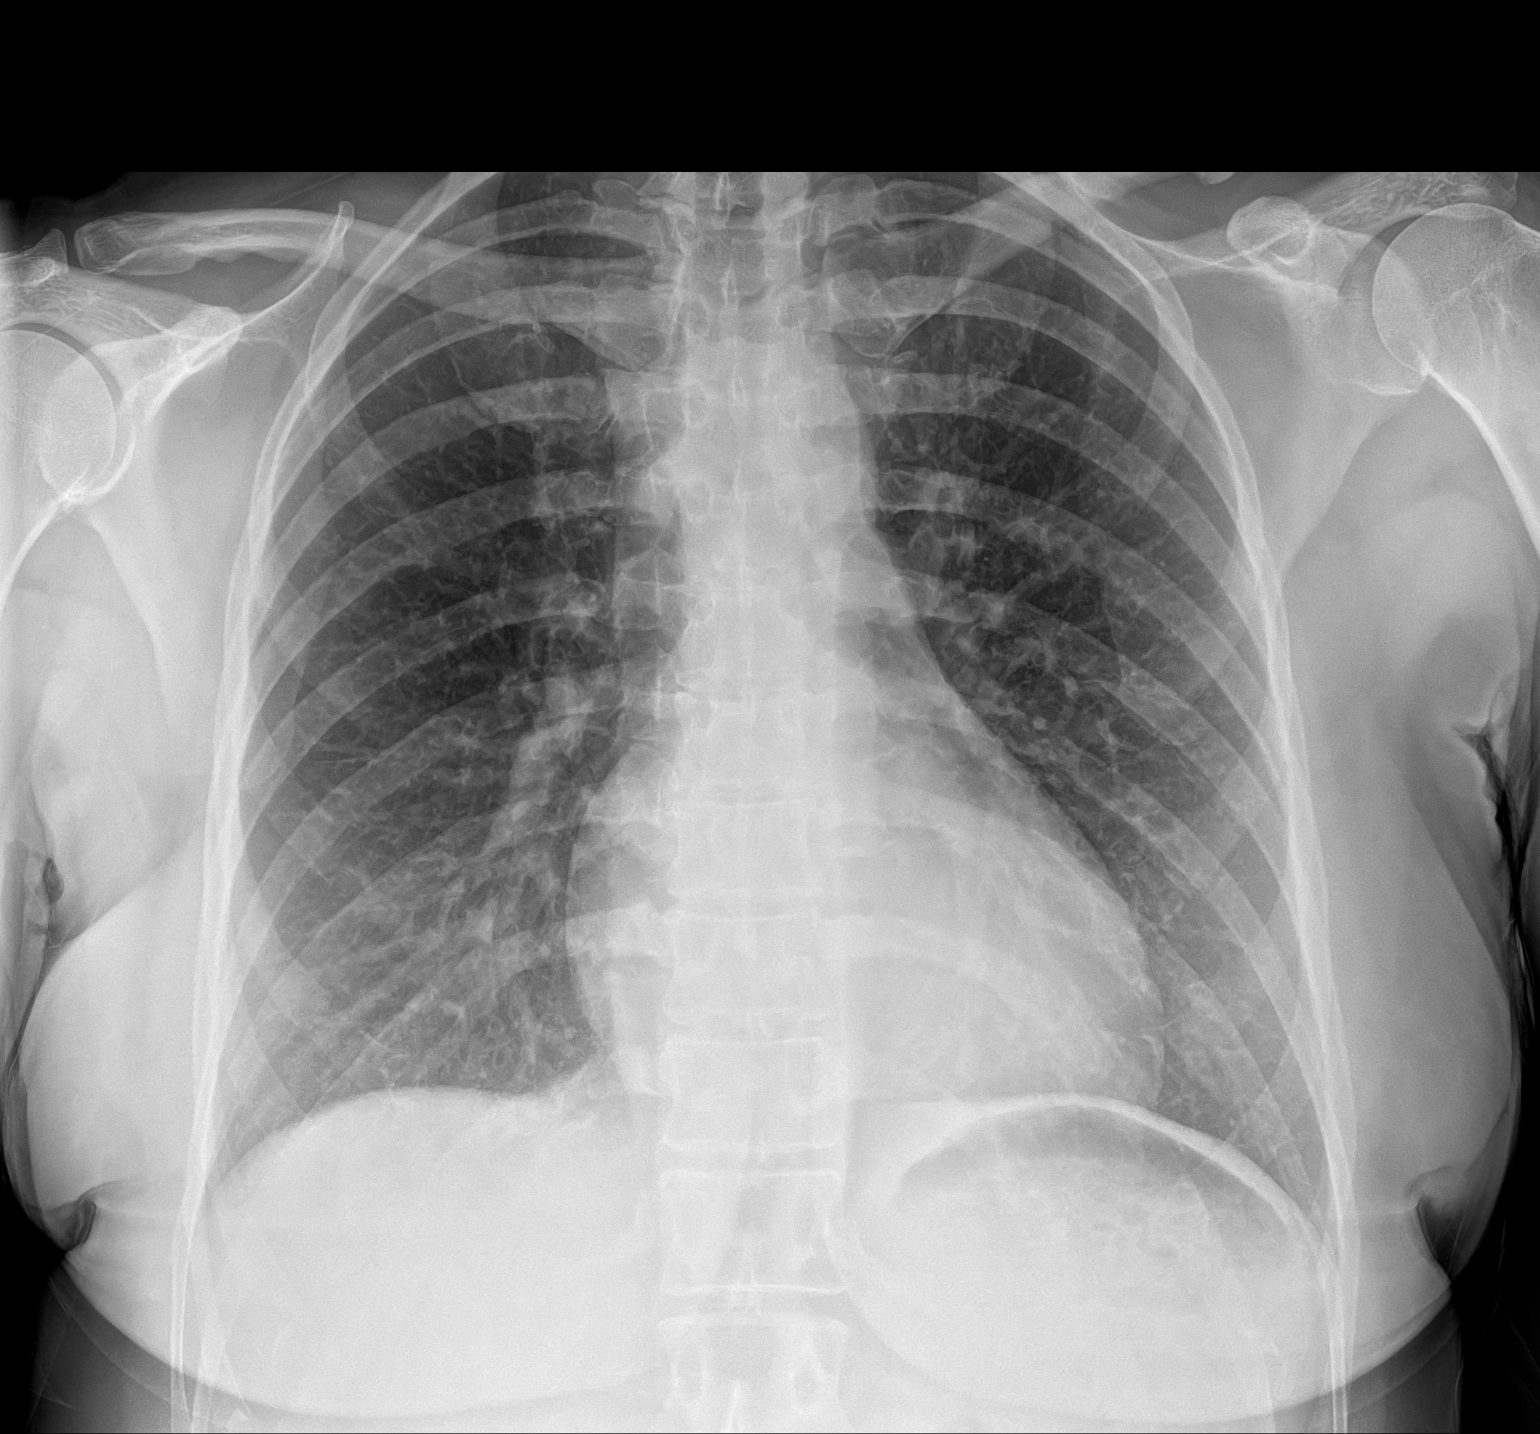

[1 of 1 positions shown; findings below may reference images not displayed]

FINDINGS: The heart size and mediastinal contours are within normal limits.
Both lungs are clear. The visualized skeletal structures are
unremarkable.
IMPRESSION: No acute abnormality noted.
